# Patient Record
Sex: Male | Born: 2000 | Race: White | Hispanic: No | Marital: Single | Smoking: Never smoker
Health system: Southern US, Community
[De-identification: ages and names within clinical notes are randomized; demographics above are authoritative.]

---
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
## 2020-10-25 ENCOUNTER — Ambulatory Visit (INDEPENDENT_AMBULATORY_CARE_PROVIDER_SITE_OTHER): Payer: 59 | Admitting: Podiatry

## 2020-10-25 ENCOUNTER — Ambulatory Visit (INDEPENDENT_AMBULATORY_CARE_PROVIDER_SITE_OTHER): Payer: 59

## 2020-10-25 ENCOUNTER — Other Ambulatory Visit: Payer: Self-pay

## 2020-10-25 DIAGNOSIS — M2141 Flat foot [pes planus] (acquired), right foot: Secondary | ICD-10-CM | POA: Diagnosis not present

## 2020-10-25 DIAGNOSIS — M2142 Flat foot [pes planus] (acquired), left foot: Secondary | ICD-10-CM | POA: Diagnosis not present

## 2020-10-25 NOTE — Progress Notes (Signed)
   Subjective:  20 y.o. male presenting today as a new patient with his father for evaluation of bilateral flat feet.  Patient is very active and is going to college in [place], [place].  He states that on days that he is very active he does have some achiness and tenderness to the feet bilaterally.  In 2016 it was recommended that he have surgery to correct his flat feet however he did not have it performed at that time.  Currently he does not do anything for treatment and does not wear any orthotics although he has been prescribed orthotics several years ago.  He presents for further treatment evaluation   No past medical history on file.     Objective/Physical Exam General: The patient is alert and oriented x3 in no acute distress.  Dermatology: Skin is warm, dry and supple bilateral lower extremities. Negative for open lesions or macerations.  Vascular: Palpable pedal pulses bilaterally. No edema or erythema noted. Capillary refill within normal limits.  Neurological: Epicritic and protective threshold grossly intact bilaterally.   Musculoskeletal Exam: Range of motion within normal limits to all pedal and ankle joints bilateral. Muscle strength 5/5 in all groups bilateral.  Upon weightbearing there is a medial longitudinal arch collapse bilaterally. Remove foot valgus noted to the bilateral lower extremities with excessive pronation upon mid stance.  Radiographic Exam:  Normal osseous mineralization. Joint spaces preserved. No fracture/dislocation/boney destruction.   Pes planus noted on radiographic exam lateral views. Decreased calcaneal inclination and metatarsal declination angle is noted. Anterior break in the cyma line noted on lateral views. Medial talar head to deviation noted on AP radiograph.   Assessment: 1. pes planus bilateral   Plan of Care:  1. Patient was evaluated. X-Rays reviewed.  2.  Appointment with Pedorthist for custom molded orthotics 3.  Today we explained  the etiology and symptoms associated to the pes planus of the feet.  Recommend that the patient wear good supportive sneakers with his orthotics  4.  Return to clinic in 6 months for reevaluation of follow-up  *Going to school in [place] for [employer]  I did surgery on his sister, [place], bilateral bunionectomies   Felecia Shelling, DPM [facility]  Dr. Felecia Shelling, DPM    [address] [place], [place] [address]                Office [phone]  Fax [phone]

## 2020-10-27 ENCOUNTER — Ambulatory Visit: Payer: 59 | Admitting: Podiatry

## 2020-11-16 ENCOUNTER — Other Ambulatory Visit: Payer: 59

## 2021-02-01 ENCOUNTER — Other Ambulatory Visit: Payer: 59

## 2021-02-02 ENCOUNTER — Other Ambulatory Visit: Payer: Self-pay

## 2021-02-02 ENCOUNTER — Ambulatory Visit (INDEPENDENT_AMBULATORY_CARE_PROVIDER_SITE_OTHER): Payer: 59 | Admitting: Podiatry

## 2021-02-02 DIAGNOSIS — M2142 Flat foot [pes planus] (acquired), left foot: Secondary | ICD-10-CM

## 2021-02-02 DIAGNOSIS — M2141 Flat foot [pes planus] (acquired), right foot: Secondary | ICD-10-CM | POA: Diagnosis not present

## 2021-02-02 NOTE — Progress Notes (Signed)
Patient presents to be casted for orthotics.  A foam impression was casted for his right and left foot  Patient is a size 8  Patient will be contacted when the orthotics are ready for pick up

## 2021-02-25 ENCOUNTER — Telehealth: Payer: Self-pay | Admitting: Podiatry

## 2021-02-25 NOTE — Telephone Encounter (Signed)
Orthotics in.. pts dad to have pt call to schedule an appt to pick them up as he has just started a new job.

## 2021-03-07 ENCOUNTER — Ambulatory Visit (INDEPENDENT_AMBULATORY_CARE_PROVIDER_SITE_OTHER): Payer: 59 | Admitting: *Deleted

## 2021-03-07 ENCOUNTER — Other Ambulatory Visit: Payer: Self-pay

## 2021-03-07 DIAGNOSIS — M2142 Flat foot [pes planus] (acquired), left foot: Secondary | ICD-10-CM

## 2021-03-07 DIAGNOSIS — M2141 Flat foot [pes planus] (acquired), right foot: Secondary | ICD-10-CM

## 2021-03-07 NOTE — Progress Notes (Signed)
Patient presents today to pick up custom molded foot orthotics, diagnosed with pes planus by Dr. Logan Bores.   Orthotics were dispensed and fit was satisfactory. Reviewed instructions for break-in and wear. Written instructions given to patient.  Patient will follow up as needed.   [facility] - order # [id number]

## 2021-03-08 ENCOUNTER — Telehealth: Payer: Self-pay | Admitting: *Deleted

## 2021-03-08 ENCOUNTER — Telehealth: Payer: Self-pay | Admitting: Podiatry

## 2021-03-08 NOTE — Telephone Encounter (Signed)
Left message for pts mom that I did get the message and have sent it to Dr Logan Bores to see what he is recommending. I just did not want her to think we were ignoring her.

## 2021-03-08 NOTE — Telephone Encounter (Signed)
Patient's mother is calling and is very unhappy with the Orthotics purchased, paper thin, speechless,and will be returning them.Please advise.

## 2021-03-08 NOTE — Telephone Encounter (Signed)
Pts mom left message stating pt picked up the orthotics yesterday and they are paper thin and garbage.She is not paying for them! They could get better orthotics at the grocery store or anywhere else. Pt has flat feet and they are not going to support him at all. She also left message she thinks on nurse line and would like a call back. Please advise

## 2021-03-09 ENCOUNTER — Telehealth: Payer: Self-pay | Admitting: Podiatry

## 2021-03-09 NOTE — Telephone Encounter (Signed)
I am curious what she returns to the office.  It would be interesting to see if there are the factory insoles that were removed from the patient shoes.  Please keep me posted.  Thanks, Dr. Logan Bores

## 2021-03-09 NOTE — Telephone Encounter (Signed)
Pts mom called me back and just wants them to be refunded as they are a piece of garbage. She said she does not trust [place] to make anymore and she will drop them off to the office. She kept saying they are just a thin piece of garbage and I explained what Dr Michel Harrow said about them being flexible to fit in the more athletic style shoes and she still is not wanting another pair. She said she did not understand what I meant when I said a more rigid pair. I said it would be a different shell and to hug the arch. She said he has no arch and to take the charges out. She asked what the cost was 100.00 and I told her the custom orthotics are 438.00.  She also left a message yesterday afternoon stating she did not understand what I meant when I said I was sending the message to the doctor as to what to do as she is not paying for them and wants the charges removed and if she has to come sit in the office until it gets removed she will.   I canceled the other order that was sent to [facility].

## 2021-03-09 NOTE — Telephone Encounter (Signed)
Left message for pt per Dr Logan Bores the orthotics were ordered like that to fit into his more athletic style shoes but we are going to order a more rigid pair for him at no charge and to call if she would like to discuss further.
